# Patient Record
Sex: Male | Born: 1960 | Race: White | Hispanic: Yes | Smoking: Current every day smoker
Health system: Southern US, Community
[De-identification: ages and names within clinical notes are randomized; demographics above are authoritative.]

---

## 2016-12-09 ENCOUNTER — Emergency Department
Admission: EM | Admit: 2016-12-09 | Discharge: 2016-12-09 | Disposition: A | Discharge status: Home / Self Care | Payer: No Typology Code available for payment source | Attending: Emergency Medicine | Admitting: Emergency Medicine

## 2016-12-09 ENCOUNTER — Emergency Department: Payer: No Typology Code available for payment source

## 2016-12-09 DIAGNOSIS — Y92410 Unspecified street and highway as the place of occurrence of the external cause: Secondary | ICD-10-CM | POA: Diagnosis not present

## 2016-12-09 DIAGNOSIS — M7918 Myalgia, other site: Secondary | ICD-10-CM

## 2016-12-09 DIAGNOSIS — Y999 Unspecified external cause status: Secondary | ICD-10-CM | POA: Diagnosis not present

## 2016-12-09 DIAGNOSIS — Y9389 Activity, other specified: Secondary | ICD-10-CM | POA: Diagnosis not present

## 2016-12-09 DIAGNOSIS — S39012A Strain of muscle, fascia and tendon of lower back, initial encounter: Secondary | ICD-10-CM | POA: Diagnosis not present

## 2016-12-09 DIAGNOSIS — F17200 Nicotine dependence, unspecified, uncomplicated: Secondary | ICD-10-CM | POA: Insufficient documentation

## 2016-12-09 DIAGNOSIS — M25531 Pain in right wrist: Secondary | ICD-10-CM | POA: Diagnosis not present

## 2016-12-09 DIAGNOSIS — S3992XA Unspecified injury of lower back, initial encounter: Secondary | ICD-10-CM | POA: Diagnosis present

## 2016-12-09 MED ORDER — CYCLOBENZAPRINE HCL 10 MG PO TABS: 10.0000 mg | ORAL_TABLET | Freq: Three times a day (TID) | ORAL | 0 refills | Status: AC | PRN

## 2016-12-09 MED ORDER — TRAMADOL HCL 50 MG PO TABS
50.0000 mg | ORAL_TABLET | Freq: Once | ORAL | Status: AC
  Administered 2016-12-09: 50 mg via ORAL
  Filled 2016-12-09: qty 1

## 2016-12-09 MED ORDER — IBUPROFEN 600 MG PO TABS
600.0000 mg | ORAL_TABLET | Freq: Once | ORAL | Status: AC
  Administered 2016-12-09: 600 mg via ORAL
  Filled 2016-12-09: qty 1

## 2016-12-09 MED ORDER — IBUPROFEN 600 MG PO TABS: 600.0000 mg | ORAL_TABLET | Freq: Three times a day (TID) | ORAL | 0 refills | Status: AC | PRN

## 2016-12-09 MED ORDER — TRAMADOL HCL 50 MG PO TABS: 50.0000 mg | ORAL_TABLET | Freq: Four times a day (QID) | ORAL | 0 refills | Status: AC | PRN

## 2016-12-09 MED ORDER — CYCLOBENZAPRINE HCL 10 MG PO TABS
10.0000 mg | ORAL_TABLET | Freq: Once | ORAL | Status: AC
  Administered 2016-12-09: 10 mg via ORAL
  Filled 2016-12-09: qty 1
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
